# Patient Record
Sex: Female | Born: 1955 | Race: White | Hispanic: No | Marital: Married
Health system: Southern US, Community
[De-identification: ages and names within clinical notes are randomized; demographics above are authoritative.]

---

## 2011-10-02 ENCOUNTER — Encounter (HOSPITAL_COMMUNITY): Payer: Self-pay

## 2011-10-02 ENCOUNTER — Emergency Department (HOSPITAL_COMMUNITY): Payer: BC Managed Care – PPO

## 2011-10-02 ENCOUNTER — Emergency Department (HOSPITAL_COMMUNITY)
Admission: EM | Admit: 2011-10-02 | Discharge: 2011-10-02 | Disposition: A | Discharge status: Home / Self Care | Payer: BC Managed Care – PPO | Attending: Emergency Medicine | Admitting: Emergency Medicine

## 2011-10-02 ENCOUNTER — Ambulatory Visit (INDEPENDENT_AMBULATORY_CARE_PROVIDER_SITE_OTHER): Payer: BC Managed Care – PPO | Admitting: Emergency Medicine

## 2011-10-02 VITALS — BP 124/80 | HR 68 | Temp 98.5°F | Resp 24

## 2011-10-02 DIAGNOSIS — R079 Chest pain, unspecified: Secondary | ICD-10-CM | POA: Insufficient documentation

## 2011-10-02 DIAGNOSIS — W208XXA Other cause of strike by thrown, projected or falling object, initial encounter: Secondary | ICD-10-CM | POA: Insufficient documentation

## 2011-10-02 DIAGNOSIS — W3189XA Contact with other specified machinery, initial encounter: Secondary | ICD-10-CM | POA: Insufficient documentation

## 2011-10-02 DIAGNOSIS — S20219A Contusion of unspecified front wall of thorax, initial encounter: Secondary | ICD-10-CM | POA: Insufficient documentation

## 2011-10-02 DIAGNOSIS — R51 Headache: Secondary | ICD-10-CM | POA: Insufficient documentation

## 2011-10-02 DIAGNOSIS — S0990XA Unspecified injury of head, initial encounter: Secondary | ICD-10-CM

## 2011-10-02 LAB — BASIC METABOLIC PANEL
CO2: 22 mEq/L (ref 19–32)
Calcium: 9.3 mg/dL (ref 8.4–10.5)
Creatinine, Ser: 0.77 mg/dL (ref 0.50–1.10)
GFR calc Af Amer: >90 mL/min (ref >90)
GFR calc non Af Amer: >90 mL/min (ref >90)
Sodium: 138 mEq/L (ref 135–145)

## 2011-10-02 LAB — DIFFERENTIAL
Basophils Absolute: 0 10*3/uL (ref 0.0–0.1)
Basophils Relative: 0 % (ref 0–1)
Eosinophils Absolute: 0.1 10*3/uL (ref 0.0–0.7)
Eosinophils Relative: 0 % (ref 0–5)
Lymphocytes Relative: 9 % — ABNORMAL LOW (ref 12–46)
Monocytes Absolute: 0.8 10*3/uL (ref 0.1–1.0)

## 2011-10-02 LAB — CBC
HCT: 45.6 % (ref 36.0–46.0)
MCHC: 34.4 g/dL (ref 30.0–36.0)
MCV: 89.4 fL (ref 78.0–100.0)
Platelets: 228 10*3/uL (ref 150–400)
RDW: 12.7 % (ref 11.5–15.5)
WBC: 15.8 10*3/uL — ABNORMAL HIGH (ref 4.0–10.5)

## 2011-10-02 MED ORDER — OXYCODONE-ACETAMINOPHEN 5-325 MG PO TABS
1.0000 | ORAL_TABLET | Freq: Once | ORAL | Status: AC
  Administered 2011-10-02: 1 via ORAL

## 2011-10-02 MED ORDER — ONDANSETRON HCL 4 MG/2ML IJ SOLN
4.0000 mg | Freq: Once | INTRAMUSCULAR | Status: DC
Start: 2011-10-02 — End: 2011-10-02
  Filled 2011-10-02: qty 2

## 2011-10-02 MED ORDER — OXYCODONE-ACETAMINOPHEN 5-325 MG PO TABS: 2.0000 | ORAL_TABLET | ORAL | Status: AC | PRN

## 2011-10-02 MED ORDER — MORPHINE SULFATE 4 MG/ML IJ SOLN
4.0000 mg | Freq: Once | INTRAMUSCULAR | Status: DC
  Filled 2011-10-02: qty 1

## 2011-10-02 MED ORDER — IOHEXOL 300 MG/ML  SOLN
80.0000 mL | Freq: Once | INTRAMUSCULAR | Status: AC | PRN
  Administered 2011-10-02: 80 mL via INTRAVENOUS

## 2011-10-02 MED ORDER — OXYCODONE-ACETAMINOPHEN 5-325 MG PO TABS
ORAL_TABLET | ORAL | Status: AC
  Filled 2011-10-02: qty 1
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
## 2011-10-02 NOTE — ED Notes (Signed)
Patient transported to CT 
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
## 2011-10-02 NOTE — ED Notes (Signed)
Pt back from CT- To restroom ambulatory- Asked to obtain urine specimen in cup just in case needed

## 2011-10-02 NOTE — Discharge Instructions (Signed)
Your head ct does NOT show any injuries to your brain or skull.  You have a small amount of blood behind your sternum. There is no evidence of injury to your heart, aorta, or lungs.  Use tylenol 500 mg every 6 hours for pain. Use percocet for more severe pain. FOllow up with your doctor if your symptoms last more than 3-4 days. Return for worse or uncontrolled symptoms. Reduce your activity and lifting until the pain resolves.

## 2011-10-27 ENCOUNTER — Encounter: Payer: Self-pay | Admitting: Emergency Medicine

## 2011-11-01 ENCOUNTER — Encounter: Payer: Self-pay | Admitting: Emergency Medicine

## 2011-12-06 ENCOUNTER — Encounter: Payer: Self-pay | Admitting: Emergency Medicine
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
## 2019-06-24 ENCOUNTER — Ambulatory Visit: Payer: BC Managed Care – PPO | Attending: Internal Medicine

## 2019-06-24 DIAGNOSIS — Z23 Encounter for immunization: Secondary | ICD-10-CM

## 2019-06-24 NOTE — Progress Notes (Signed)
   Covid-19 Vaccination Clinic  Name:  Vickee Mormino    MRN: [id number] DOB: [birth year]  06/24/2019  Ms. Castell was observed post Covid-19 immunization for 15 minutes without incidence. She was provided with Vaccine Information Sheet and instruction to access the V-Safe system.   Ms. Aquilar was instructed to call 911 with any severe reactions post vaccine: Marland Kitchen Difficulty breathing  . Swelling of your face and throat  . A fast heartbeat  . A bad rash all over your body  . Dizziness and weakness    Immunizations Administered    Name Date Dose VIS Date Route   Pfizer COVID-19 Vaccine 06/24/2019  2:41 PM 0.3 mL 04/13/2019 Intramuscular   Manufacturer: [employer], [employer]   Lot: [id number]   NDC: [address]-3

## 2019-07-21 ENCOUNTER — Ambulatory Visit: Payer: BC Managed Care – PPO | Attending: Internal Medicine

## 2019-07-21 DIAGNOSIS — Z23 Encounter for immunization: Secondary | ICD-10-CM

## 2019-07-21 NOTE — Progress Notes (Signed)
   Covid-19 Vaccination Clinic  Name:  Grace Hopkins    MRN: [id number] DOB: 01-30-1956  07/21/2019  [name] was observed post Covid-19 immunization for 15 minutes without incident. She was provided with Vaccine Information Sheet and instruction to access the V-Safe system.   [name] was instructed to call 911 with any severe reactions post vaccine: Marland Kitchen Difficulty breathing  . Swelling of face and throat  . A fast heartbeat  . A bad rash all over body  . Dizziness and weakness   Immunizations Administered    Name Date Dose VIS Date Route   Pfizer COVID-19 Vaccine 07/21/2019 10:02 AM 0.3 mL 04/13/2019 Intramuscular   Manufacturer: [employer], [employer]   Lot: [id number]   NDC: [address]-5

## 2019-07-22 ENCOUNTER — Ambulatory Visit: Payer: Self-pay
